# Patient Record
Sex: Female | Born: 1935 | Hispanic: No | Smoking: Never smoker
Health system: Southern US, Community
[De-identification: ages and names within clinical notes are randomized; demographics above are authoritative.]

## PROBLEM LIST (undated history)

## (undated) DIAGNOSIS — I10 Essential (primary) hypertension: Secondary | ICD-10-CM

## (undated) HISTORY — PX: ABDOMINAL HYSTERECTOMY: SHX81

---

## 2013-08-19 ENCOUNTER — Encounter (HOSPITAL_COMMUNITY): Payer: Self-pay | Admitting: Emergency Medicine

## 2013-08-19 ENCOUNTER — Emergency Department (INDEPENDENT_AMBULATORY_CARE_PROVIDER_SITE_OTHER)
Admission: EM | Admit: 2013-08-19 | Discharge: 2013-08-19 | Disposition: A | Discharge status: Home / Self Care | Payer: Self-pay | Source: Home / Self Care | Attending: Emergency Medicine | Admitting: Emergency Medicine

## 2013-08-19 DIAGNOSIS — M5430 Sciatica, unspecified side: Secondary | ICD-10-CM

## 2013-08-19 HISTORY — DX: Essential (primary) hypertension: I10

## 2013-08-19 MED ORDER — HYDROCODONE-ACETAMINOPHEN 5-325 MG PO TABS: 1.0000 | ORAL_TABLET | Freq: Four times a day (QID) | ORAL | Status: AC | PRN

## 2013-08-19 MED ORDER — KETOROLAC TROMETHAMINE 60 MG/2ML IM SOLN
INTRAMUSCULAR | Status: AC
  Filled 2013-08-19: qty 2

## 2013-08-19 MED ORDER — METHYLPREDNISOLONE ACETATE 80 MG/ML IJ SUSP
INTRAMUSCULAR | Status: AC
  Filled 2013-08-19: qty 1

## 2013-08-19 MED ORDER — METHYLPREDNISOLONE ACETATE 40 MG/ML IJ SUSP
80.0000 mg | Freq: Once | INTRAMUSCULAR | Status: AC
  Administered 2013-08-19: 80 mg via INTRAMUSCULAR

## 2013-08-19 MED ORDER — KETOROLAC TROMETHAMINE 60 MG/2ML IM SOLN
60.0000 mg | Freq: Once | INTRAMUSCULAR | Status: AC
  Administered 2013-08-19: 60 mg via INTRAMUSCULAR

## 2013-08-19 NOTE — ED Notes (Signed)
Via daughter Tobin Chad); Pt c/o sciatic pain on left side onset 1 month Sxs include: numbness and tingly sensation Given Etodolac 400mg  w/no relief by PCP at [place] Denies inj/trauma Alert w/no signs of acute distress.

## 2013-08-19 NOTE — Discharge Instructions (Signed)

## 2013-08-19 NOTE — ED Provider Notes (Signed)
CSN: [id number]     Arrival date & time 08/19/13  1194 History   First MD Initiated Contact with Patient 08/19/13 1129     Chief Complaint  Patient presents with  . Sciatica   (Consider location/radiation/quality/duration/timing/severity/associated sxs/prior Treatment) HPI Comments: 79 year old female presents for evaluation of sciatica that is not responding to medicines that were given to her by her primary care provider. This started about one month ago with pain going from her left buttocks down her left leg. Her daughter took her to see a primary care physician who prescribed a 12 day course of prednisone. The pain got better but she then began to have numbness in the left leg. That same physician then prescribed 400 mg Lodine twice a day which is not helping. After trying to Lodine for 10 days, she decided to follow up here to see what else can be done. She is having shooting pain and paresthesias in her left leg. This pain is worse when she goes from a seated to a standing position and is somewhat relieved by walking around. She still has sensation in both her feet. She denies footdrop, loss of bowel or bladder control, or genital numbness. She denies any injury at the onset of her symptoms.   Past Medical History  Diagnosis Date  . Hypertension    History reviewed. No pertinent past surgical history. No family history on file. History  Substance Use Topics  . Smoking status: Never Smoker   . Smokeless tobacco: Not on file  . Alcohol Use: No   OB History   Grav Para Term Preterm Abortions TAB SAB Ect Mult Living                 Review of Systems  Gastrointestinal: Negative for constipation.  Genitourinary: Negative for difficulty urinating.  Musculoskeletal: Positive for back pain (lumbar).       See history of present illness  Neurological: Positive for numbness.       Shooting pain down left leg  All other systems reviewed and are negative.    Allergies  Review of  patient's allergies indicates no known allergies.  Home Medications   Current Outpatient Rx  Name  Route  Sig  Dispense  Refill  . etodolac (LODINE) 400 MG tablet   Oral   Take 400 mg by mouth 2 (two) times daily.         . hydrochlorothiazide (HYDRODIURIL) 25 MG tablet   Oral   Take 25 mg by mouth daily.         Marland Kitchen losartan (COZAAR) 50 MG tablet   Oral   Take 50 mg by mouth daily.         Marland Kitchen HYDROcodone-acetaminophen (NORCO) 5-325 MG per tablet   Oral   Take 1 tablet by mouth every 6 (six) hours as needed for moderate pain.   30 tablet   0    BP 180/115  Pulse 68  Temp(Src) 97.8 F (36.6 C) (Oral)  Resp 16  SpO2 97% Physical Exam  Nursing note and vitals reviewed. Constitutional: She is oriented to person, place, and time. Vital signs are normal. She appears well-developed and well-nourished. No distress.  HENT:  Head: Normocephalic and atraumatic.  Pulmonary/Chest: Effort normal. No respiratory distress.  Musculoskeletal:       Lumbar back: She exhibits tenderness (left sided pain at the area of the SI joint and through the buttocks) and pain. She exhibits normal range of motion, no bony tenderness, no deformity  and no spasm.  Positive straight leg raise test, negative cross straight leg raise  Neurological: She is alert and oriented to person, place, and time. She has normal strength and normal reflexes. She displays no atrophy. No sensory deficit. Coordination and gait normal. GCS eye subscore is 4. GCS verbal subscore is 5. GCS motor subscore is 6.  Skin: Skin is warm and dry. No rash noted. She is not diaphoretic.  Psychiatric: She has a normal mood and affect. Judgment normal.    ED Course  Procedures (including critical care time) Labs Review Labs Reviewed - No data to display Imaging Review No results found.   MDM   1. Sciatica    Symptoms are suggestive of a herniated disc. I will refer her to orthopedics for followup. I will give her an  injection of 80 mg Depo-Medrol and 60 mg Toradol here. She should continue the etodolac, will add when necessary Norco for the time being, she will probably benefit from physical therapy.  Discharge Medication List as of 08/19/2013 11:30 AM    START taking these medications   Details  HYDROcodone-acetaminophen (NORCO) 5-325 MG per tablet Take 1 tablet by mouth every 6 (six) hours as needed for moderate pain., Starting 08/19/2013, Until Discontinued, [place] Seon Gaertner, PA-C 08/19/13 2247

## 2013-08-21 ENCOUNTER — Emergency Department (HOSPITAL_COMMUNITY)
Admission: EM | Admit: 2013-08-21 | Discharge: 2013-08-22 | Disposition: A | Discharge status: Home / Self Care | Payer: Medicaid Other | Attending: Emergency Medicine | Admitting: Emergency Medicine

## 2013-08-21 ENCOUNTER — Encounter (HOSPITAL_COMMUNITY): Payer: Self-pay | Admitting: Emergency Medicine

## 2013-08-21 DIAGNOSIS — Z79899 Other long term (current) drug therapy: Secondary | ICD-10-CM | POA: Insufficient documentation

## 2013-08-21 DIAGNOSIS — M5430 Sciatica, unspecified side: Secondary | ICD-10-CM | POA: Insufficient documentation

## 2013-08-21 DIAGNOSIS — I10 Essential (primary) hypertension: Secondary | ICD-10-CM | POA: Insufficient documentation

## 2013-08-21 DIAGNOSIS — R1906 Epigastric swelling, mass or lump: Secondary | ICD-10-CM

## 2013-08-21 DIAGNOSIS — K2970 Gastritis, unspecified, without bleeding: Secondary | ICD-10-CM | POA: Insufficient documentation

## 2013-08-21 DIAGNOSIS — K2990 Gastroduodenitis, unspecified, without bleeding: Principal | ICD-10-CM

## 2013-08-21 MED ORDER — MORPHINE SULFATE 4 MG/ML IJ SOLN
4.0000 mg | Freq: Once | INTRAMUSCULAR | Status: AC
Start: 2013-08-22 — End: 2013-08-22
  Administered 2013-08-22: 4 mg via INTRAVENOUS
  Filled 2013-08-21: qty 1

## 2013-08-21 NOTE — ED Provider Notes (Signed)
Medical screening examination/treatment/procedure(s) were performed by non-physician practitioner and as supervising physician I was immediately available for consultation/collaboration.  Philipp Deputy, M.D.  Harden Mo, MD 08/21/13 2105

## 2013-08-21 NOTE — ED Notes (Signed)
Pt is c/o left leg pain that goes down all the way down to her foot started about 3 weeks ago  Pt was given cortisone injection Tuesday and was given something for pain  Since then she has been c/o abd pain and 2 episodes of vomiting yesterday

## 2013-08-22 ENCOUNTER — Emergency Department (HOSPITAL_COMMUNITY): Payer: Medicaid Other

## 2013-08-22 ENCOUNTER — Encounter (HOSPITAL_COMMUNITY): Payer: Self-pay

## 2013-08-22 LAB — CBC WITH DIFFERENTIAL/PLATELET
BASOS ABS: 0.1 10*3/uL (ref 0.0–0.1)
Basophils Relative: 0 % (ref 0–1)
EOS ABS: 0.3 10*3/uL (ref 0.0–0.7)
EOS PCT: 3 % (ref 0–5)
HEMATOCRIT: 35.1 % — AB (ref 36.0–46.0)
Hemoglobin: 12.1 g/dL (ref 12.0–15.0)
Lymphocytes Relative: 24 % (ref 12–46)
Lymphs Abs: 2.7 10*3/uL (ref 0.7–4.0)
MCH: 29.2 pg (ref 26.0–34.0)
MCHC: 34.5 g/dL (ref 30.0–36.0)
MCV: 84.6 fL (ref 78.0–100.0)
MONO ABS: 1.1 10*3/uL — AB (ref 0.1–1.0)
Monocytes Relative: 10 % (ref 3–12)
Neutro Abs: 7 10*3/uL (ref 1.7–7.7)
Neutrophils Relative %: 62 % (ref 43–77)
PLATELETS: 244 10*3/uL (ref 150–400)
RBC: 4.15 MIL/uL (ref 3.87–5.11)
RDW: 13.7 % (ref 11.5–15.5)
WBC: 11.2 10*3/uL — ABNORMAL HIGH (ref 4.0–10.5)

## 2013-08-22 LAB — BASIC METABOLIC PANEL
BUN: 21 mg/dL (ref 6–23)
CALCIUM: 9.7 mg/dL (ref 8.4–10.5)
CO2: 26 meq/L (ref 19–32)
CREATININE: 1.04 mg/dL (ref 0.50–1.10)
Chloride: 101 mEq/L (ref 96–112)
GFR calc Af Amer: 59 mL/min — ABNORMAL LOW (ref >90)
GFR, EST NON AFRICAN AMERICAN: 50 mL/min — AB (ref >90)
Glucose, Bld: 102 mg/dL — ABNORMAL HIGH (ref 70–99)
Potassium: 3.7 mEq/L (ref 3.7–5.3)
SODIUM: 140 meq/L (ref 137–147)

## 2013-08-22 LAB — URINALYSIS, ROUTINE W REFLEX MICROSCOPIC
GLUCOSE, UA: NEGATIVE mg/dL
Hgb urine dipstick: NEGATIVE
KETONES UR: NEGATIVE mg/dL
Leukocytes, UA: NEGATIVE
Nitrite: NEGATIVE
PROTEIN: NEGATIVE mg/dL
Specific Gravity, Urine: 1.003 — ABNORMAL LOW (ref 1.005–1.030)
UROBILINOGEN UA: 0.2 mg/dL (ref 0.0–1.0)
pH: 7.5 (ref 5.0–8.0)

## 2013-08-22 MED ORDER — GABAPENTIN 300 MG PO CAPS: 300.0000 mg | ORAL_CAPSULE | Freq: Two times a day (BID) | ORAL | Status: AC

## 2013-08-22 MED ORDER — IOHEXOL 300 MG/ML  SOLN
100.0000 mL | Freq: Once | INTRAMUSCULAR | Status: AC | PRN
  Administered 2013-08-22: 100 mL via INTRAVENOUS

## 2013-08-22 MED ORDER — OMEPRAZOLE 20 MG PO CPDR: 20.0000 mg | DELAYED_RELEASE_CAPSULE | Freq: Every day | ORAL | Status: AC

## 2013-08-22 MED ORDER — HYDROCODONE-ACETAMINOPHEN 5-325 MG PO TABS: 1.0000 | ORAL_TABLET | Freq: Four times a day (QID) | ORAL | Status: AC | PRN

## 2013-08-22 MED ORDER — GI COCKTAIL ~~LOC~~
30.0000 mL | Freq: Once | ORAL | Status: AC
  Administered 2013-08-22: 30 mL via ORAL
  Filled 2013-08-22: qty 30

## 2013-08-22 MED ORDER — IOHEXOL 300 MG/ML  SOLN
50.0000 mL | Freq: Once | INTRAMUSCULAR | Status: AC | PRN
  Administered 2013-08-22: 50 mL via ORAL

## 2013-08-22 NOTE — Progress Notes (Signed)
   CARE MANAGEMENT ED NOTE 08/22/2013  Patient:  Erica Hubbard, Erica Hubbard   Account Number:  [id number]  Date Initiated:  08/22/2013  Documentation initiated by:  Jackelyn Poling  Subjective/Objective Assessment:   79 yr old self pay pt with a [place] address listed Pt with 2 visits to [facility] ED in last 3 days/6 months First [facility] EPIC entry listed as 08/19/13     Subjective/Objective Assessment Detail:     Action/Plan:   Cm received voice message left by EDP at 0703 08/22/13  Cm reviewed EPIC EDP progress notes, labs   Action/Plan Detail:   1430 CM left a voice message at [phone] [facility] wellness center to attempt to obtain pt an appointment  1432 Cm also consulted with [facility] and requested a post card be sent to pt to assist with self pay pcps in [place] and the P4CC/GCCN program   Anticipated DC Date:  08/22/2013     Status Recommendation to Physician:   Result of Recommendation:    Other ED Services  Consult Working [place]  Other  Outpatient Services - Pt will follow up  PCP issues  GCCN / P4[facility] (established/new)    Choice offered to / List presented to:            Status of service:  Completed, signed off  ED Comments:   ED Comments Detail:  08/22/13 1514 ED CM sent email to K rivera at [facility] also requesting an appointment for pt

## 2013-08-22 NOTE — Discharge Instructions (Signed)
We saw you in the ER for the abdominal pain and the back pain.  Results of the CT scan show: IMPRESSION:  1. No definite acute findings in the abdomen or pelvis to account  for the patient's symptoms.  2. However, there is marked circumferential thickening of the antral  pre-pyloric region of the stomach. Although this could represent a  focal area of gastritis, the possibility of underlying neoplasm is  not excluded, and correlation with nonemergent endoscopy is  recommended to better evaluate this region.  3. Normal appendix.  4. Additional incidental findings, as above.  See the GI doctor as requested. See the Orthopdic doctor for Sciatica if needed.  Sciatica Sciatica is pain, weakness, numbness, or tingling along the path of the sciatic nerve. The nerve starts in the lower back and runs down the back of each leg. The nerve controls the muscles in the lower leg and in the back of the knee, while also providing sensation to the back of the thigh, lower leg, and the sole of your foot. Sciatica is a symptom of another medical condition. For instance, nerve damage or certain conditions, such as a herniated disk or bone spur on the spine, pinch or put pressure on the sciatic nerve. This causes the pain, weakness, or other sensations normally associated with sciatica. Generally, sciatica only affects one side of the body. CAUSES   Herniated or slipped disc.  Degenerative disk disease.  A pain disorder involving the narrow muscle in the buttocks (piriformis syndrome).  Pelvic injury or fracture.  Pregnancy.  Tumor (rare). SYMPTOMS  Symptoms can vary from mild to very severe. The symptoms usually travel from the low back to the buttocks and down the back of the leg. Symptoms can include:  Mild tingling or dull aches in the lower back, leg, or hip.  Numbness in the back of the calf or sole of the foot.  Burning sensations in the lower back, leg, or hip.  Sharp pains in the lower  back, leg, or hip.  Leg weakness.  Severe back pain inhibiting movement. These symptoms may get worse with coughing, sneezing, laughing, or prolonged sitting or standing. Also, being overweight may worsen symptoms. DIAGNOSIS  Your caregiver will perform a physical exam to look for common symptoms of sciatica. He or she may ask you to do certain movements or activities that would trigger sciatic nerve pain. Other tests may be performed to find the cause of the sciatica. These may include:  Blood tests.  X-rays.  Imaging tests, such as an MRI or CT scan. TREATMENT  Treatment is directed at the cause of the sciatic pain. Sometimes, treatment is not necessary and the pain and discomfort goes away on its own. If treatment is needed, your caregiver may suggest:  Over-the-counter medicines to relieve pain.  Prescription medicines, such as anti-inflammatory medicine, muscle relaxants, or narcotics.  Applying heat or ice to the painful area.  Steroid injections to lessen pain, irritation, and inflammation around the nerve.  Reducing activity during periods of pain.  Exercising and stretching to strengthen your abdomen and improve flexibility of your spine. Your caregiver may suggest losing weight if the extra weight makes the back pain worse.  Physical therapy.  Surgery to eliminate what is pressing or pinching the nerve, such as a bone spur or part of a herniated disk. HOME CARE INSTRUCTIONS   Only take over-the-counter or prescription medicines for pain or discomfort as directed by your caregiver.  Apply ice to the affected  area for 20 minutes, 3 4 times a day for the first 48 72 hours. Then try heat in the same way.  Exercise, stretch, or perform your usual activities if these do not aggravate your pain.  Attend physical therapy sessions as directed by your caregiver.  Keep all follow-up appointments as directed by your caregiver.  Do not wear high heels or shoes that do not  provide proper support.  Check your mattress to see if it is too soft. A firm mattress may lessen your pain and discomfort. SEEK IMMEDIATE MEDICAL CARE IF:   You lose control of your bowel or bladder (incontinence).  You have increasing weakness in the lower back, pelvis, buttocks, or legs.  You have redness or swelling of your back.  You have a burning sensation when you urinate.  You have pain that gets worse when you lie down or awakens you at night.  Your pain is worse than you have experienced in the past.  Your pain is lasting longer than 4 weeks.  You are suddenly losing weight without reason. MAKE SURE YOU:  Understand these instructions.  Will watch your condition.  Will get help right away if you are not doing well or get worse. Document Released: 05/09/2001 Document Revised: 11/14/2011 Document Reviewed: 09/24/2011 [facility] Patient Information 2014 [place]. Gastritis, Adult Gastritis is soreness and swelling (inflammation) of the lining of the stomach. Gastritis can develop as a sudden onset (acute) or long-term (chronic) condition. If gastritis is not treated, it can lead to stomach bleeding and ulcers. CAUSES  Gastritis occurs when the stomach lining is weak or damaged. Digestive juices from the stomach then inflame the weakened stomach lining. The stomach lining may be weak or damaged due to viral or bacterial infections. One common bacterial infection is the Helicobacter pylori infection. Gastritis can also result from excessive alcohol consumption, taking certain medicines, or having too much acid in the stomach.  SYMPTOMS  In some cases, there are no symptoms. When symptoms are present, they may include:  Pain or a burning sensation in the upper abdomen.  Nausea.  Vomiting.  An uncomfortable feeling of fullness after eating. DIAGNOSIS  Your caregiver may suspect you have gastritis based on your symptoms and a physical exam. To determine the cause  of your gastritis, your caregiver may perform the following:  Blood or stool tests to check for the H pylori bacterium.  Gastroscopy. A thin, flexible tube (endoscope) is passed down the esophagus and into the stomach. The endoscope has a light and camera on the end. Your caregiver uses the endoscope to view the inside of the stomach.  Taking a tissue sample (biopsy) from the stomach to examine under a microscope. TREATMENT  Depending on the cause of your gastritis, medicines may be prescribed. If you have a bacterial infection, such as an H pylori infection, antibiotics may be given. If your gastritis is caused by too much acid in the stomach, H2 blockers or antacids may be given. Your caregiver may recommend that you stop taking aspirin, ibuprofen, or other nonsteroidal anti-inflammatory drugs (NSAIDs). HOME CARE INSTRUCTIONS  Only take over-the-counter or prescription medicines as directed by your caregiver.  If you were given antibiotic medicines, take them as directed. Finish them even if you start to feel better.  Drink enough fluids to keep your urine clear or pale yellow.  Avoid foods and drinks that make your symptoms worse, such as:  Caffeine or alcoholic drinks.  Chocolate.  Peppermint or mint flavorings.  Garlic and onions.  Spicy foods.  Citrus fruits, such as oranges, lemons, or limes.  Tomato-based foods such as sauce, chili, salsa, and pizza.  Fried and fatty foods.  Eat small, frequent meals instead of large meals. SEEK IMMEDIATE MEDICAL CARE IF:   You have black or dark red stools.  You vomit blood or material that looks like coffee grounds.  You are unable to keep fluids down.  Your abdominal pain gets worse.  You have a fever.  You do not feel better after 1 week.  You have any other questions or concerns. MAKE SURE YOU:  Understand these instructions.  Will watch your condition.  Will get help right away if you are not doing well or get  worse. Document Released: 05/09/2001 Document Revised: 11/14/2011 Document Reviewed: 06/28/2011 [facility] Patient Information 2014 [place].

## 2013-08-22 NOTE — ED Notes (Signed)
Pt ambulated to BR unassisted at this time

## 2013-08-22 NOTE — ED Notes (Signed)
Held Morphine at this time per MD verbal order

## 2013-08-22 NOTE — ED Provider Notes (Signed)
CSN: [id number]     Arrival date & time 08/21/13  2219 History   First MD Initiated Contact with Patient 08/21/13 2338     Chief Complaint  Patient presents with  . Abdominal Pain  . Leg Pain     (Consider location/radiation/quality/duration/timing/severity/associated sxs/prior Treatment) HPI Comments: Pt comes in with cc of abdominal pain. Hx of HTN, recent Sciatica. Daughter helped with translation and comfortable with that role. Pt states that she started having buttock pain, radiating to the toes. Pt got etodolac, and prednisone for her pain and was diagnosed with Sciatica by her pcp. Her pain got worse again on Tuesday, she went to urgent care, where she had IM steroid injection given. That evening, she started having abd pain, diffuse in the lower quadrants, and the epigastrium. + emesis x 2 yday, no n/v today. She has had no blood in the emesis or any bloody stools. No hx of similar pain. No hx of PUD, diverticular disease. She has no uti like sx.  Patient is a 79 y.o. female presenting with abdominal pain and leg pain. The history is provided by the patient and a relative. The history is limited by a language barrier. No language interpreter was used.  Abdominal Pain Associated symptoms: nausea and vomiting   Associated symptoms: no chest pain, no constipation, no diarrhea, no dysuria and no shortness of breath   Leg Pain Associated symptoms: no neck pain     Past Medical History  Diagnosis Date  . Hypertension    Past Surgical History  Procedure Laterality Date  . Abdominal hysterectomy     Family History  Problem Relation Age of Onset  . Hypertension Mother    History  Substance Use Topics  . Smoking status: Never Smoker   . Smokeless tobacco: Not on file  . Alcohol Use: No   OB History   Grav Para Term Preterm Abortions TAB SAB Ect Mult Living                 Review of Systems  Constitutional: Positive for activity change.  Respiratory: Negative for shortness  of breath.   Cardiovascular: Negative for chest pain.  Gastrointestinal: Positive for nausea, vomiting and abdominal pain. Negative for diarrhea, constipation and blood in stool.  Genitourinary: Negative for dysuria and flank pain.  Musculoskeletal: Negative for neck pain.  Neurological: Negative for headaches.  All other systems reviewed and are negative.      Allergies  Cortisone  Home Medications   Current Outpatient Rx  Name  Route  Sig  Dispense  Refill  . acetaminophen (TYLENOL) 500 MG tablet   Oral   Take 1,000 mg by mouth every 6 (six) hours as needed for mild pain or headache.         . etodolac (LODINE) 400 MG tablet   Oral   Take 400 mg by mouth 2 (two) times daily.         . famotidine (PEPCID) 20 MG tablet   Oral   Take 20 mg by mouth daily as needed for heartburn or indigestion.         . hydrochlorothiazide (HYDRODIURIL) 25 MG tablet   Oral   Take 25 mg by mouth daily.         Marland Kitchen HYDROcodone-acetaminophen (NORCO) 5-325 MG per tablet   Oral   Take 1 tablet by mouth every 6 (six) hours as needed for moderate pain.   30 tablet   0   . losartan (COZAAR) 50  MG tablet   Oral   Take 50 mg by mouth daily.         . predniSONE (DELTASONE) 10 MG tablet   Oral   Take 10 mg by mouth See admin instructions. Day 1 & 2: take 6 tabs Day 3 & 4: 5 tabs Day 5 & 6: 4 tabs Day 7 & 8: 3 tabs Day 9 & 10: 2 tabs Day 11 & 12: 1 tab          BP 174/78  Pulse 69  Temp(Src) 98.1 F (36.7 C) (Oral)  Resp 18  SpO2 97% Physical Exam  Nursing note and vitals reviewed. Constitutional: She is oriented to person, place, and time. She appears well-developed and well-nourished.  HENT:  Head: Normocephalic and atraumatic.  Eyes: EOM are normal. Pupils are equal, round, and reactive to light.  Neck: Neck supple.  Cardiovascular: Normal rate, regular rhythm and normal heart sounds.   No murmur heard. Pulmonary/Chest: Effort normal. No respiratory distress.   Abdominal: Soft. She exhibits no distension. There is tenderness. There is no rebound and no guarding.  Epigastric and suprapubic, LLQ tenderness on exam, no cva tenderness.  Neurological: She is alert and oriented to person, place, and time.  Skin: Skin is warm and dry.    ED Course  Procedures (including critical care time) Labs Review Labs Reviewed  CBC WITH DIFFERENTIAL - Abnormal; Notable for the following:    WBC 11.2 (*)    HCT 35.1 (*)    Monocytes Absolute 1.1 (*)    All other components within normal limits  BASIC METABOLIC PANEL  URINALYSIS, ROUTINE W REFLEX MICROSCOPIC   Imaging Review No results found.   EKG Interpretation None      MDM   Final diagnoses:  None    Pt comes in with cc of leg pain. Sounds like sciatica - has an ortho f.u with Dr. Percell Miller already scheduled.  Her primary complain today is abd pain. She has no hx of this pain - constant, with intermittent waxing and waning. Epigastric and LLQ tenderness on exam.  She was on prednisone and nsaids, bid - PUD is possible. Diverticular pain for the LLQ pain. Also UTI with the supapubic pain. No peritoneal signs.   7:03 AM CT ordered, as she had persistent abd pain. Has no PCP. GI f/u is given. I left a message for [facility] - to see if they can set up an appointment for her with [facility]. Ph # was provided at registration.  Varney Biles, MD 08/22/13 [phone]

## 2013-08-27 ENCOUNTER — Other Ambulatory Visit (HOSPITAL_COMMUNITY): Payer: Self-pay | Admitting: Orthopedic Surgery

## 2013-08-27 DIAGNOSIS — M545 Low back pain, unspecified: Secondary | ICD-10-CM

## 2013-08-27 DIAGNOSIS — M5410 Radiculopathy, site unspecified: Secondary | ICD-10-CM

## 2013-09-15 ENCOUNTER — Ambulatory Visit (HOSPITAL_COMMUNITY)
Admission: RE | Admit: 2013-09-15 | Discharge: 2013-09-15 | Disposition: A | Discharge status: Home / Self Care | Payer: Medicaid Other | Source: Ambulatory Visit | Attending: Orthopedic Surgery | Admitting: Orthopedic Surgery

## 2013-09-15 DIAGNOSIS — M48061 Spinal stenosis, lumbar region without neurogenic claudication: Secondary | ICD-10-CM | POA: Diagnosis not present

## 2013-09-15 DIAGNOSIS — M5137 Other intervertebral disc degeneration, lumbosacral region: Secondary | ICD-10-CM | POA: Diagnosis not present

## 2013-09-15 DIAGNOSIS — M51379 Other intervertebral disc degeneration, lumbosacral region without mention of lumbar back pain or lower extremity pain: Secondary | ICD-10-CM | POA: Insufficient documentation

## 2013-09-15 DIAGNOSIS — M79609 Pain in unspecified limb: Secondary | ICD-10-CM | POA: Diagnosis present

## 2013-09-15 DIAGNOSIS — M5126 Other intervertebral disc displacement, lumbar region: Secondary | ICD-10-CM | POA: Diagnosis not present

## 2013-09-15 DIAGNOSIS — M545 Low back pain, unspecified: Secondary | ICD-10-CM

## 2013-09-15 DIAGNOSIS — M5410 Radiculopathy, site unspecified: Secondary | ICD-10-CM

## 2013-10-29 ENCOUNTER — Ambulatory Visit: Payer: Self-pay | Admitting: Internal Medicine

## 2013-12-04 DIAGNOSIS — F0390 Unspecified dementia without behavioral disturbance: Secondary | ICD-10-CM | POA: Diagnosis not present

## 2014-01-22 DIAGNOSIS — Z23 Encounter for immunization: Secondary | ICD-10-CM | POA: Diagnosis not present

## 2014-01-22 DIAGNOSIS — N390 Urinary tract infection, site not specified: Secondary | ICD-10-CM | POA: Diagnosis not present

## 2014-01-22 DIAGNOSIS — M5430 Sciatica, unspecified side: Secondary | ICD-10-CM | POA: Diagnosis not present

## 2014-01-22 DIAGNOSIS — I10 Essential (primary) hypertension: Secondary | ICD-10-CM | POA: Diagnosis not present

## 2014-01-22 DIAGNOSIS — R109 Unspecified abdominal pain: Secondary | ICD-10-CM | POA: Diagnosis not present

## 2014-01-22 DIAGNOSIS — Z79899 Other long term (current) drug therapy: Secondary | ICD-10-CM | POA: Diagnosis not present

## 2014-01-22 DIAGNOSIS — L219 Seborrheic dermatitis, unspecified: Secondary | ICD-10-CM | POA: Diagnosis not present

## 2014-02-04 ENCOUNTER — Other Ambulatory Visit: Payer: Self-pay | Admitting: Family Medicine

## 2014-02-04 DIAGNOSIS — R1084 Generalized abdominal pain: Secondary | ICD-10-CM

## 2014-02-13 ENCOUNTER — Ambulatory Visit
Admission: RE | Admit: 2014-02-13 | Discharge: 2014-02-13 | Disposition: A | Discharge status: Home / Self Care | Payer: Medicaid Other | Source: Ambulatory Visit | Attending: Family Medicine | Admitting: Family Medicine

## 2014-02-13 DIAGNOSIS — R109 Unspecified abdominal pain: Secondary | ICD-10-CM | POA: Diagnosis not present

## 2014-02-13 DIAGNOSIS — R1084 Generalized abdominal pain: Secondary | ICD-10-CM

## 2014-03-05 DIAGNOSIS — G629 Polyneuropathy, unspecified: Secondary | ICD-10-CM | POA: Diagnosis not present

## 2014-03-05 DIAGNOSIS — R109 Unspecified abdominal pain: Secondary | ICD-10-CM | POA: Diagnosis not present

## 2014-03-05 DIAGNOSIS — M5417 Radiculopathy, lumbosacral region: Secondary | ICD-10-CM | POA: Diagnosis not present

## 2014-06-12 DIAGNOSIS — L659 Nonscarring hair loss, unspecified: Secondary | ICD-10-CM | POA: Diagnosis not present

## 2014-06-12 DIAGNOSIS — I10 Essential (primary) hypertension: Secondary | ICD-10-CM | POA: Diagnosis not present

## 2014-06-12 DIAGNOSIS — M5417 Radiculopathy, lumbosacral region: Secondary | ICD-10-CM | POA: Diagnosis not present

## 2014-07-23 DIAGNOSIS — I10 Essential (primary) hypertension: Secondary | ICD-10-CM | POA: Diagnosis not present

## 2014-07-23 DIAGNOSIS — R0981 Nasal congestion: Secondary | ICD-10-CM | POA: Diagnosis not present

## 2014-07-23 DIAGNOSIS — H109 Unspecified conjunctivitis: Secondary | ICD-10-CM | POA: Diagnosis not present

## 2014-07-23 DIAGNOSIS — S43421A Sprain of right rotator cuff capsule, initial encounter: Secondary | ICD-10-CM | POA: Diagnosis not present

## 2014-07-27 DIAGNOSIS — N390 Urinary tract infection, site not specified: Secondary | ICD-10-CM | POA: Diagnosis not present

## 2014-07-27 DIAGNOSIS — E780 Pure hypercholesterolemia: Secondary | ICD-10-CM | POA: Diagnosis not present

## 2014-07-27 DIAGNOSIS — Z0000 Encounter for general adult medical examination without abnormal findings: Secondary | ICD-10-CM | POA: Diagnosis not present

## 2014-07-27 DIAGNOSIS — Z79899 Other long term (current) drug therapy: Secondary | ICD-10-CM | POA: Diagnosis not present

## 2014-07-27 DIAGNOSIS — I10 Essential (primary) hypertension: Secondary | ICD-10-CM | POA: Diagnosis not present

## 2014-07-27 DIAGNOSIS — L309 Dermatitis, unspecified: Secondary | ICD-10-CM | POA: Diagnosis not present

## 2014-07-27 DIAGNOSIS — N183 Chronic kidney disease, stage 3 (moderate): Secondary | ICD-10-CM | POA: Diagnosis not present

## 2014-08-31 ENCOUNTER — Ambulatory Visit
Admission: RE | Admit: 2014-08-31 | Discharge: 2014-08-31 | Disposition: A | Discharge status: Home / Self Care | Payer: Medicaid Other | Source: Ambulatory Visit | Attending: Family Medicine | Admitting: Family Medicine

## 2014-08-31 ENCOUNTER — Other Ambulatory Visit: Payer: Self-pay | Admitting: Family Medicine

## 2014-08-31 DIAGNOSIS — M5417 Radiculopathy, lumbosacral region: Secondary | ICD-10-CM | POA: Diagnosis not present

## 2014-08-31 DIAGNOSIS — M5416 Radiculopathy, lumbar region: Secondary | ICD-10-CM

## 2014-08-31 DIAGNOSIS — D1809 Hemangioma of other sites: Secondary | ICD-10-CM | POA: Diagnosis not present

## 2014-08-31 DIAGNOSIS — M4726 Other spondylosis with radiculopathy, lumbar region: Secondary | ICD-10-CM | POA: Diagnosis not present

## 2014-08-31 DIAGNOSIS — I10 Essential (primary) hypertension: Secondary | ICD-10-CM | POA: Diagnosis not present

## 2014-09-10 DIAGNOSIS — M5416 Radiculopathy, lumbar region: Secondary | ICD-10-CM | POA: Diagnosis not present

## 2014-09-23 DIAGNOSIS — M5126 Other intervertebral disc displacement, lumbar region: Secondary | ICD-10-CM | POA: Diagnosis not present

## 2014-09-23 DIAGNOSIS — M5417 Radiculopathy, lumbosacral region: Secondary | ICD-10-CM | POA: Diagnosis not present

## 2014-10-20 DIAGNOSIS — Z1231 Encounter for screening mammogram for malignant neoplasm of breast: Secondary | ICD-10-CM | POA: Diagnosis not present

## 2014-10-30 DIAGNOSIS — M5417 Radiculopathy, lumbosacral region: Secondary | ICD-10-CM | POA: Diagnosis not present

## 2014-10-30 DIAGNOSIS — M25511 Pain in right shoulder: Secondary | ICD-10-CM | POA: Diagnosis not present

## 2014-10-30 DIAGNOSIS — I10 Essential (primary) hypertension: Secondary | ICD-10-CM | POA: Diagnosis not present

## 2015-04-09 DIAGNOSIS — J309 Allergic rhinitis, unspecified: Secondary | ICD-10-CM | POA: Diagnosis not present

## 2015-04-09 DIAGNOSIS — I10 Essential (primary) hypertension: Secondary | ICD-10-CM | POA: Diagnosis not present

## 2015-04-09 DIAGNOSIS — G4700 Insomnia, unspecified: Secondary | ICD-10-CM | POA: Diagnosis not present

## 2015-04-09 DIAGNOSIS — G629 Polyneuropathy, unspecified: Secondary | ICD-10-CM | POA: Diagnosis not present

## 2015-04-09 DIAGNOSIS — M25511 Pain in right shoulder: Secondary | ICD-10-CM | POA: Diagnosis not present

## 2015-06-17 DIAGNOSIS — I10 Essential (primary) hypertension: Secondary | ICD-10-CM | POA: Diagnosis not present

## 2015-06-17 DIAGNOSIS — M5417 Radiculopathy, lumbosacral region: Secondary | ICD-10-CM | POA: Diagnosis not present

## 2015-06-29 IMAGING — CR DG LUMBAR SPINE COMPLETE 4+V
5 series · 5 of 5 positions shown · non-contrast
Comparison: None.

CLINICAL DATA: Radiculopathy.

EXAM:
LUMBAR SPINE - COMPLETE 4+ VIEW

[t l-spine a.p.]
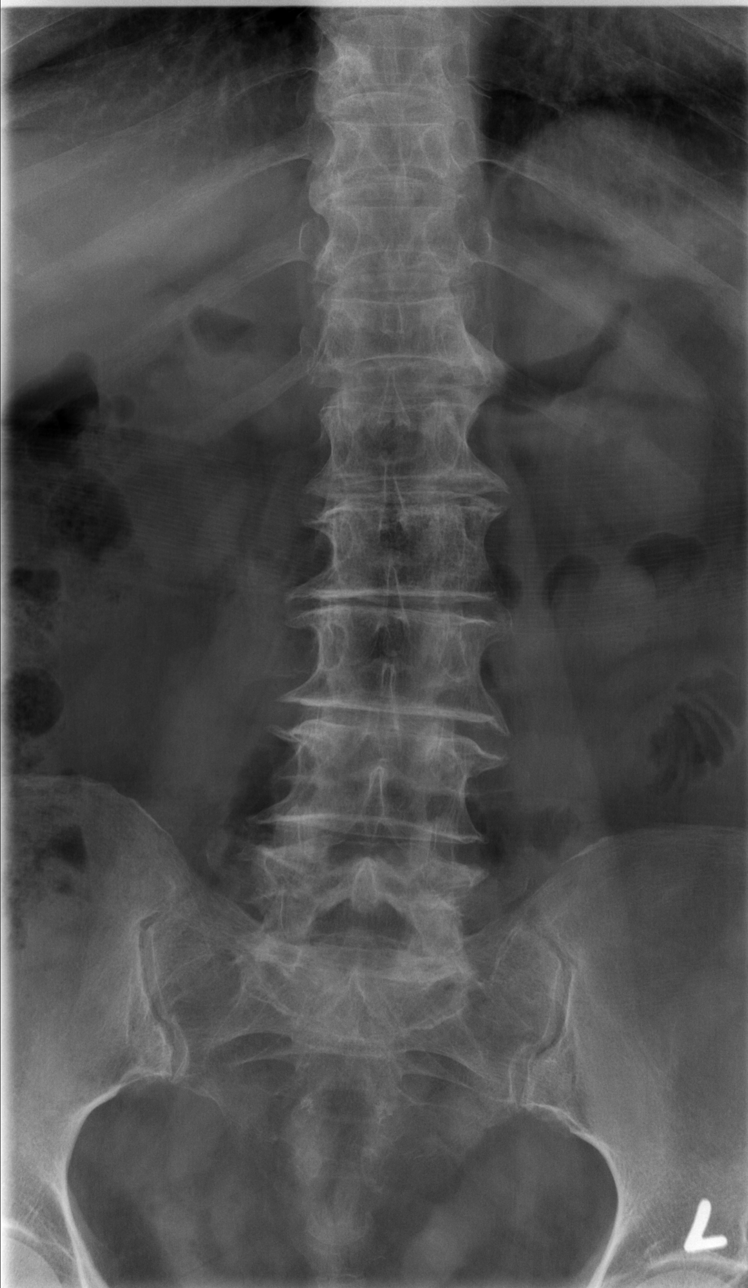

[t l-spine oblique exposure (1 of 2)]
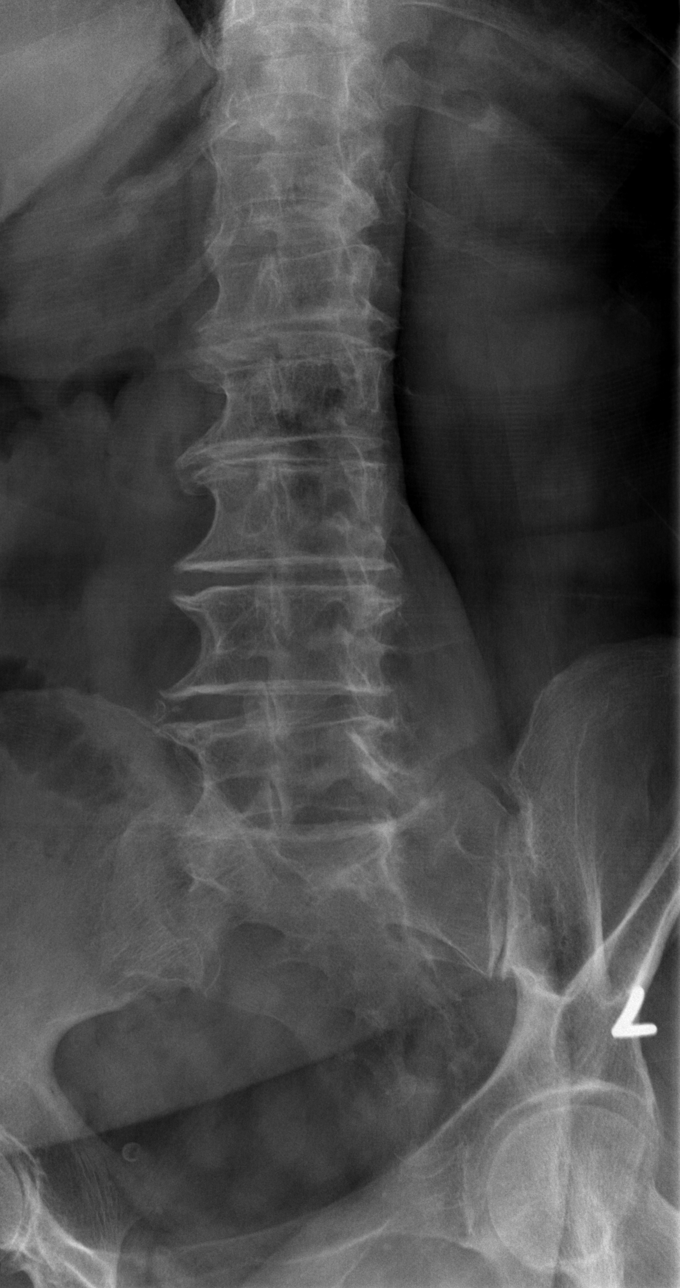

[t l-spine oblique exposure (2 of 2)]
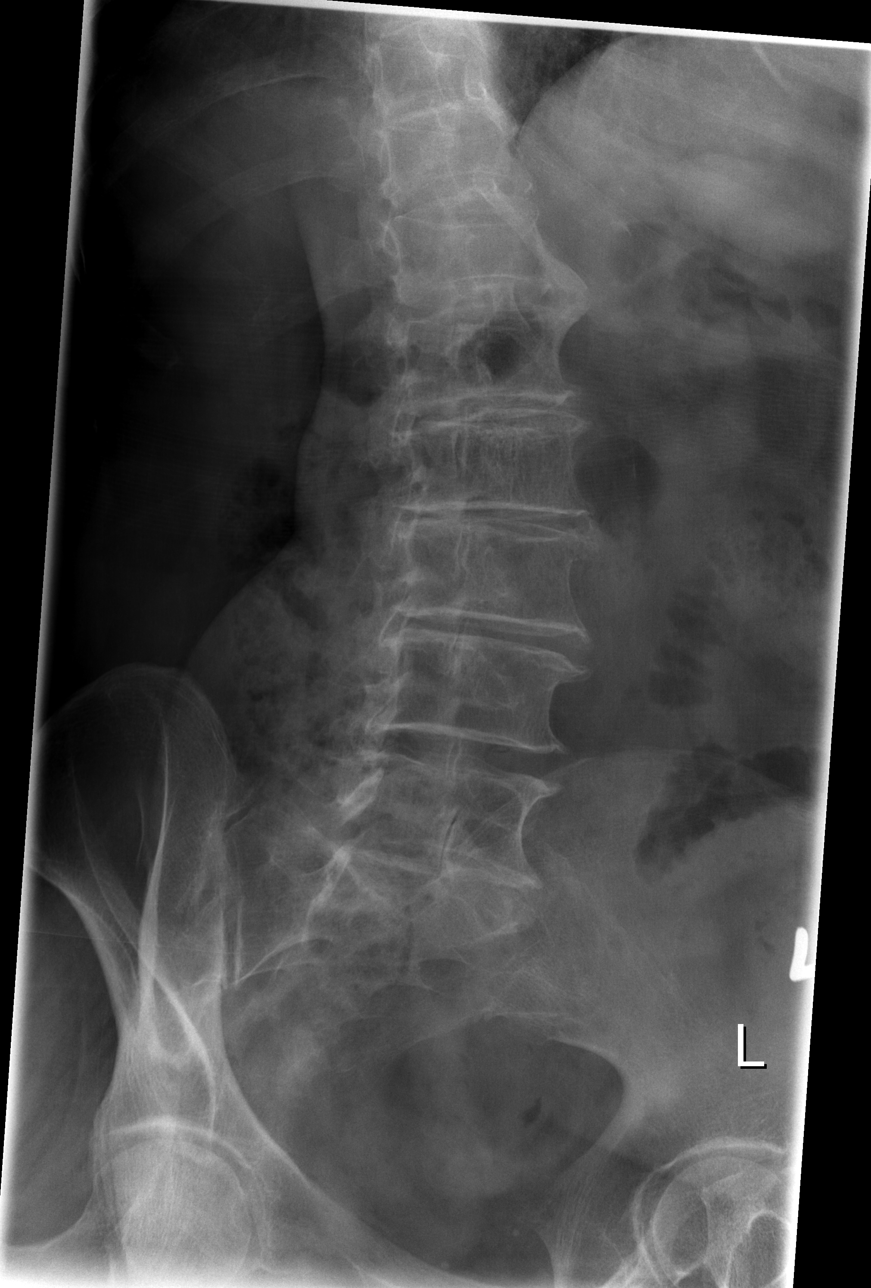

[t l-spine lat]
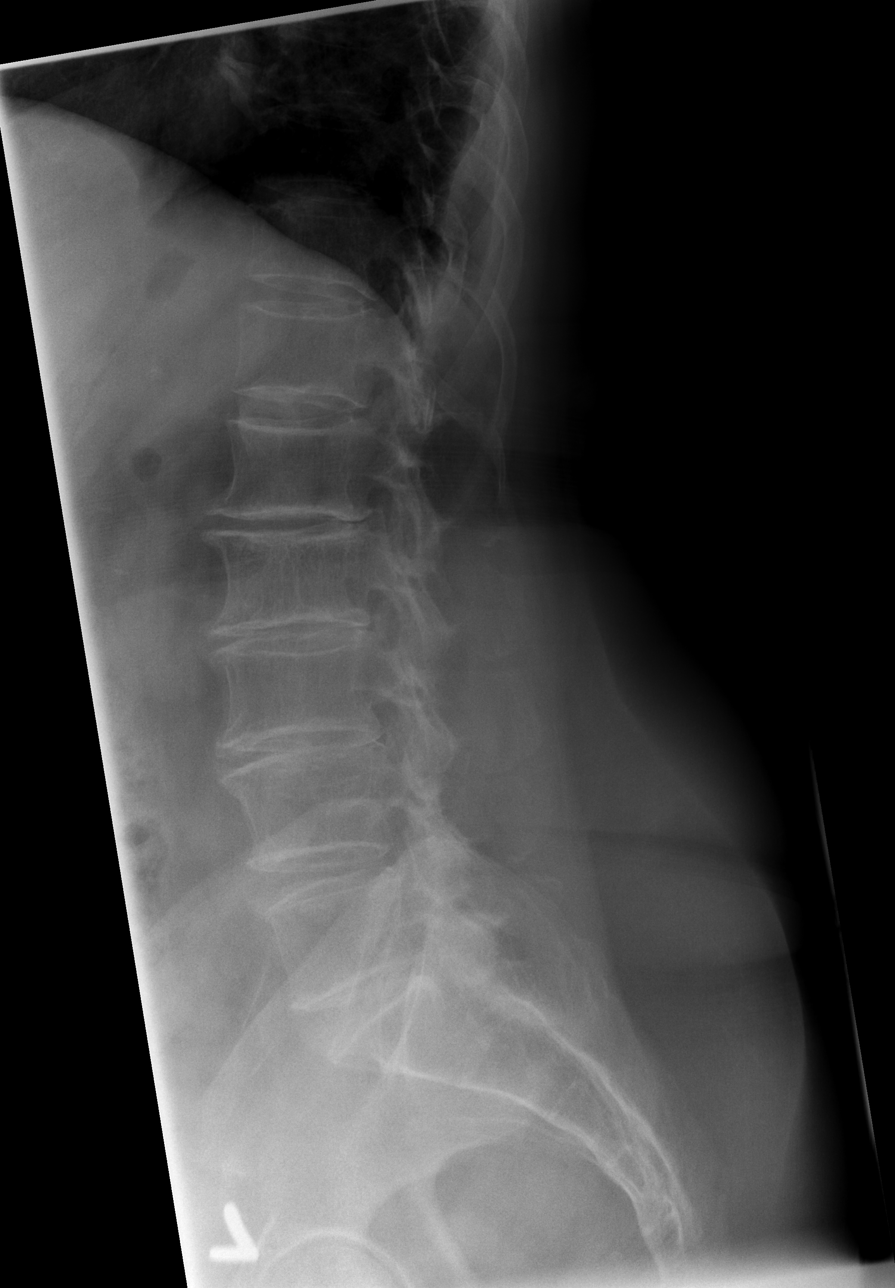

[t l-spine l5-s1 spot]
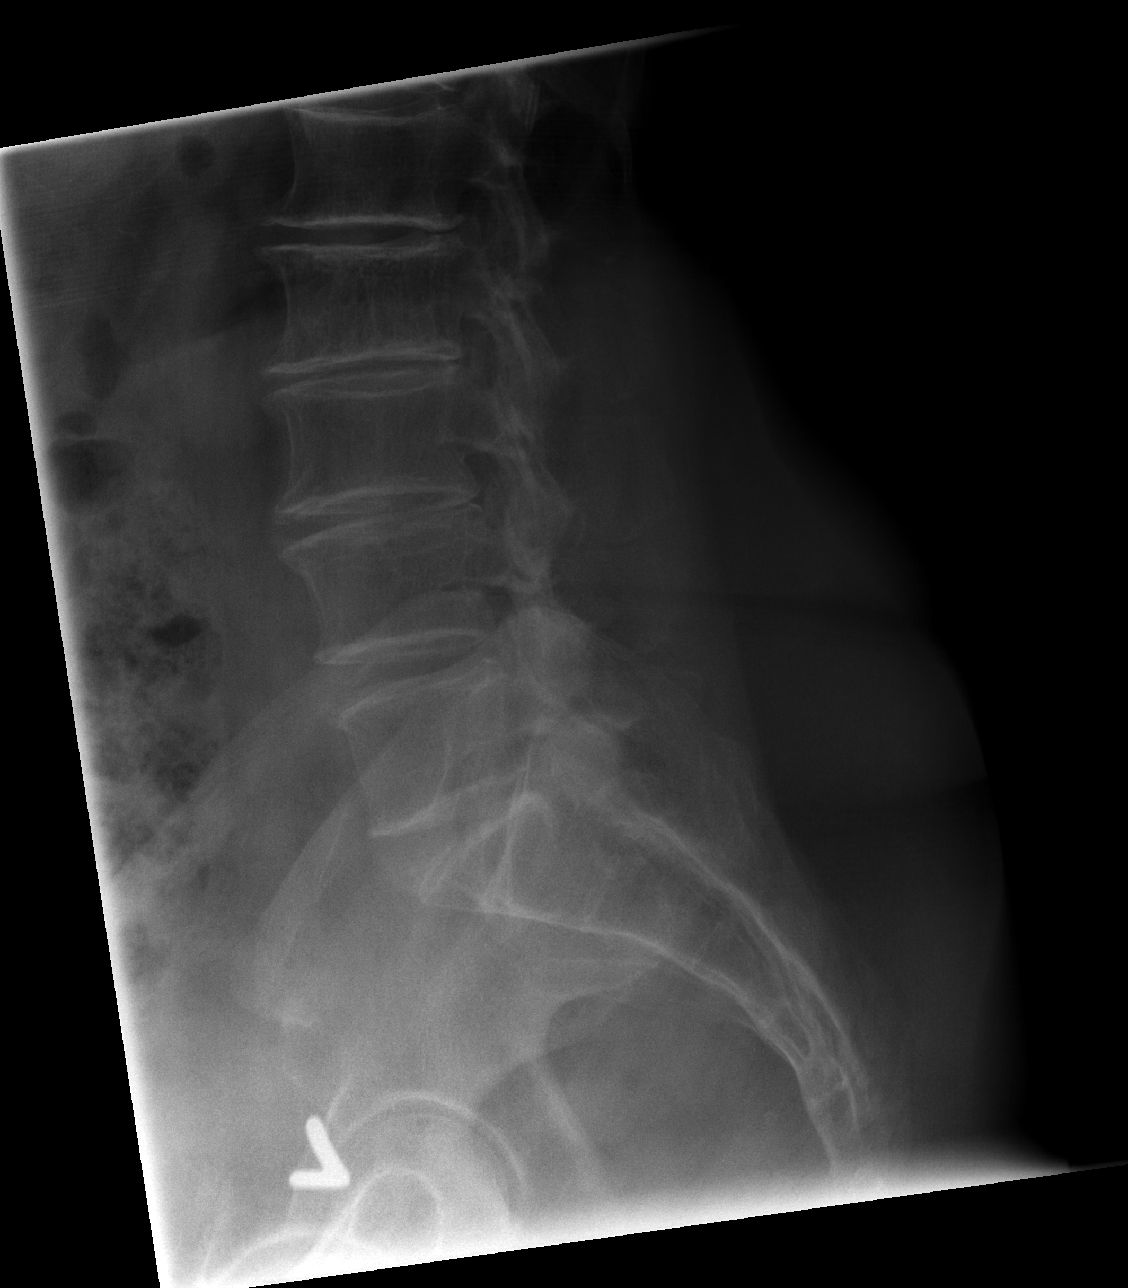

[5 of 5 positions shown; findings below may reference images not displayed]

FINDINGS: Diffuse degenerative change of the lumbar spine. Diffuse osteopenia.
No acute abnormality identified. Findings suggesting L2 hemangioma
again noted.
IMPRESSION: Diffuse degenerative changes lumbar spine. No acute abnormality.
Stable hemangioma L2.

## 2015-09-07 DIAGNOSIS — G4700 Insomnia, unspecified: Secondary | ICD-10-CM | POA: Diagnosis not present

## 2015-09-07 DIAGNOSIS — I10 Essential (primary) hypertension: Secondary | ICD-10-CM | POA: Diagnosis not present

## 2015-09-07 DIAGNOSIS — R05 Cough: Secondary | ICD-10-CM | POA: Diagnosis not present

## 2015-09-07 DIAGNOSIS — M5417 Radiculopathy, lumbosacral region: Secondary | ICD-10-CM | POA: Diagnosis not present

## 2015-10-18 DIAGNOSIS — Z23 Encounter for immunization: Secondary | ICD-10-CM | POA: Diagnosis not present

## 2015-10-18 DIAGNOSIS — Z0000 Encounter for general adult medical examination without abnormal findings: Secondary | ICD-10-CM | POA: Diagnosis not present

## 2015-10-18 DIAGNOSIS — Z6831 Body mass index (BMI) 31.0-31.9, adult: Secondary | ICD-10-CM | POA: Diagnosis not present

## 2015-10-18 DIAGNOSIS — E7800 Pure hypercholesterolemia, unspecified: Secondary | ICD-10-CM | POA: Diagnosis not present

## 2015-10-18 DIAGNOSIS — D692 Other nonthrombocytopenic purpura: Secondary | ICD-10-CM | POA: Diagnosis not present

## 2015-10-18 DIAGNOSIS — M25519 Pain in unspecified shoulder: Secondary | ICD-10-CM | POA: Diagnosis not present

## 2015-10-18 DIAGNOSIS — I10 Essential (primary) hypertension: Secondary | ICD-10-CM | POA: Diagnosis not present

## 2015-10-18 DIAGNOSIS — Z79899 Other long term (current) drug therapy: Secondary | ICD-10-CM | POA: Diagnosis not present

## 2015-10-18 DIAGNOSIS — N183 Chronic kidney disease, stage 3 (moderate): Secondary | ICD-10-CM | POA: Diagnosis not present

## 2015-11-12 DIAGNOSIS — M7542 Impingement syndrome of left shoulder: Secondary | ICD-10-CM | POA: Diagnosis not present

## 2015-11-12 DIAGNOSIS — M5417 Radiculopathy, lumbosacral region: Secondary | ICD-10-CM | POA: Diagnosis not present

## 2015-11-12 DIAGNOSIS — Z6829 Body mass index (BMI) 29.0-29.9, adult: Secondary | ICD-10-CM | POA: Diagnosis not present

## 2016-06-20 DIAGNOSIS — G4700 Insomnia, unspecified: Secondary | ICD-10-CM | POA: Diagnosis not present

## 2016-06-20 DIAGNOSIS — H6121 Impacted cerumen, right ear: Secondary | ICD-10-CM | POA: Diagnosis not present

## 2016-06-20 DIAGNOSIS — M5416 Radiculopathy, lumbar region: Secondary | ICD-10-CM | POA: Diagnosis not present

## 2016-06-20 DIAGNOSIS — I10 Essential (primary) hypertension: Secondary | ICD-10-CM | POA: Diagnosis not present

## 2016-09-14 DIAGNOSIS — M25569 Pain in unspecified knee: Secondary | ICD-10-CM | POA: Diagnosis not present
